# Patient Record
Sex: Female | Born: 1945 | Race: White | Hispanic: No | Marital: Married | Smoking: Never smoker
Health system: Southern US, Community
[De-identification: ages and names within clinical notes are randomized; demographics above are authoritative.]

## PROBLEM LIST (undated history)

## (undated) DIAGNOSIS — F329 Major depressive disorder, single episode, unspecified: Secondary | ICD-10-CM

## (undated) DIAGNOSIS — F32A Depression, unspecified: Secondary | ICD-10-CM

## (undated) HISTORY — PX: FACIAL COSMETIC SURGERY: SHX629

## (undated) HISTORY — PX: BREAST EXCISIONAL BIOPSY: SUR124

## (undated) HISTORY — PX: ABDOMINAL HYSTERECTOMY: SHX81

---

## 2006-04-06 ENCOUNTER — Other Ambulatory Visit: Admission: RE | Admit: 2006-04-06 | Discharge: 2006-04-06 | Payer: Self-pay | Admitting: Gynecology

## 2006-04-12 ENCOUNTER — Encounter: Admission: RE | Admit: 2006-04-12 | Discharge: 2006-04-12 | Payer: Self-pay | Admitting: Gynecology

## 2006-05-02 ENCOUNTER — Encounter: Admission: RE | Admit: 2006-05-02 | Discharge: 2006-05-02 | Payer: Self-pay | Admitting: Gynecology

## 2008-12-08 ENCOUNTER — Other Ambulatory Visit: Admission: RE | Admit: 2008-12-08 | Discharge: 2008-12-08 | Payer: Self-pay | Admitting: Family Medicine

## 2009-01-14 ENCOUNTER — Encounter: Admission: RE | Admit: 2009-01-14 | Discharge: 2009-01-14 | Payer: Self-pay | Admitting: Family Medicine

## 2010-03-02 ENCOUNTER — Encounter: Admission: RE | Admit: 2010-03-02 | Discharge: 2010-03-02 | Payer: Self-pay | Admitting: Family Medicine

## 2011-03-27 ENCOUNTER — Other Ambulatory Visit: Payer: Self-pay | Admitting: Family Medicine

## 2011-03-27 DIAGNOSIS — Z1231 Encounter for screening mammogram for malignant neoplasm of breast: Secondary | ICD-10-CM

## 2011-03-29 ENCOUNTER — Ambulatory Visit
Admission: RE | Admit: 2011-03-29 | Discharge: 2011-03-29 | Disposition: A | Discharge status: Home / Self Care | Payer: BC Managed Care – PPO | Source: Ambulatory Visit | Attending: Family Medicine | Admitting: Family Medicine

## 2011-03-29 DIAGNOSIS — Z1231 Encounter for screening mammogram for malignant neoplasm of breast: Secondary | ICD-10-CM

## 2012-03-29 ENCOUNTER — Other Ambulatory Visit: Payer: Self-pay | Admitting: Family Medicine

## 2012-03-29 DIAGNOSIS — Z1231 Encounter for screening mammogram for malignant neoplasm of breast: Secondary | ICD-10-CM

## 2012-04-11 ENCOUNTER — Ambulatory Visit
Admission: RE | Admit: 2012-04-11 | Discharge: 2012-04-11 | Disposition: A | Discharge status: Home / Self Care | Payer: BC Managed Care – PPO | Source: Ambulatory Visit | Attending: Family Medicine | Admitting: Family Medicine

## 2012-04-11 DIAGNOSIS — Z1231 Encounter for screening mammogram for malignant neoplasm of breast: Secondary | ICD-10-CM

## 2013-04-08 ENCOUNTER — Other Ambulatory Visit: Payer: Self-pay

## 2013-04-08 DIAGNOSIS — Z1231 Encounter for screening mammogram for malignant neoplasm of breast: Secondary | ICD-10-CM

## 2013-04-23 ENCOUNTER — Ambulatory Visit
Admission: RE | Admit: 2013-04-23 | Discharge: 2013-04-23 | Disposition: A | Discharge status: Home / Self Care | Payer: BC Managed Care – PPO | Source: Ambulatory Visit

## 2013-04-23 DIAGNOSIS — Z1231 Encounter for screening mammogram for malignant neoplasm of breast: Secondary | ICD-10-CM

## 2014-10-29 ENCOUNTER — Other Ambulatory Visit: Payer: Self-pay

## 2014-10-29 DIAGNOSIS — Z1231 Encounter for screening mammogram for malignant neoplasm of breast: Secondary | ICD-10-CM

## 2014-11-03 ENCOUNTER — Ambulatory Visit
Admission: RE | Admit: 2014-11-03 | Discharge: 2014-11-03 | Disposition: A | Discharge status: Home / Self Care | Payer: Commercial Managed Care - HMO | Source: Ambulatory Visit

## 2014-11-03 DIAGNOSIS — Z1231 Encounter for screening mammogram for malignant neoplasm of breast: Secondary | ICD-10-CM

## 2015-11-08 ENCOUNTER — Other Ambulatory Visit: Payer: Self-pay

## 2015-11-08 DIAGNOSIS — Z1231 Encounter for screening mammogram for malignant neoplasm of breast: Secondary | ICD-10-CM

## 2015-11-10 ENCOUNTER — Ambulatory Visit
Admission: RE | Admit: 2015-11-10 | Discharge: 2015-11-10 | Disposition: A | Discharge status: Home / Self Care | Payer: Medicare HMO | Source: Ambulatory Visit

## 2015-11-10 DIAGNOSIS — Z1231 Encounter for screening mammogram for malignant neoplasm of breast: Secondary | ICD-10-CM

## 2015-11-23 DIAGNOSIS — Z5181 Encounter for therapeutic drug level monitoring: Secondary | ICD-10-CM | POA: Diagnosis not present

## 2015-11-23 DIAGNOSIS — R7301 Impaired fasting glucose: Secondary | ICD-10-CM | POA: Diagnosis not present

## 2015-11-23 DIAGNOSIS — L99 Other disorders of skin and subcutaneous tissue in diseases classified elsewhere: Secondary | ICD-10-CM | POA: Diagnosis not present

## 2015-11-23 DIAGNOSIS — R69 Illness, unspecified: Secondary | ICD-10-CM | POA: Diagnosis not present

## 2015-11-23 DIAGNOSIS — K219 Gastro-esophageal reflux disease without esophagitis: Secondary | ICD-10-CM | POA: Diagnosis not present

## 2015-11-23 DIAGNOSIS — Z0000 Encounter for general adult medical examination without abnormal findings: Secondary | ICD-10-CM | POA: Diagnosis not present

## 2015-11-23 DIAGNOSIS — E785 Hyperlipidemia, unspecified: Secondary | ICD-10-CM | POA: Diagnosis not present

## 2015-11-23 DIAGNOSIS — Z79899 Other long term (current) drug therapy: Secondary | ICD-10-CM | POA: Diagnosis not present

## 2015-11-23 DIAGNOSIS — E559 Vitamin D deficiency, unspecified: Secondary | ICD-10-CM | POA: Diagnosis not present

## 2015-12-28 DIAGNOSIS — Z1211 Encounter for screening for malignant neoplasm of colon: Secondary | ICD-10-CM | POA: Diagnosis not present

## 2016-06-20 DIAGNOSIS — N959 Unspecified menopausal and perimenopausal disorder: Secondary | ICD-10-CM | POA: Diagnosis not present

## 2016-06-20 DIAGNOSIS — Z23 Encounter for immunization: Secondary | ICD-10-CM | POA: Diagnosis not present

## 2016-11-13 ENCOUNTER — Other Ambulatory Visit: Payer: Self-pay | Admitting: Family Medicine

## 2016-11-13 DIAGNOSIS — Z1231 Encounter for screening mammogram for malignant neoplasm of breast: Secondary | ICD-10-CM

## 2016-11-30 ENCOUNTER — Ambulatory Visit
Admission: RE | Admit: 2016-11-30 | Discharge: 2016-11-30 | Disposition: A | Discharge status: Home / Self Care | Payer: Medicare HMO | Source: Ambulatory Visit | Attending: Family Medicine | Admitting: Family Medicine

## 2016-11-30 ENCOUNTER — Ambulatory Visit: Payer: Medicare HMO

## 2016-11-30 DIAGNOSIS — Z1231 Encounter for screening mammogram for malignant neoplasm of breast: Secondary | ICD-10-CM | POA: Diagnosis not present

## 2016-12-01 ENCOUNTER — Other Ambulatory Visit: Payer: Self-pay | Admitting: Family Medicine

## 2016-12-01 DIAGNOSIS — R928 Other abnormal and inconclusive findings on diagnostic imaging of breast: Secondary | ICD-10-CM

## 2016-12-05 DIAGNOSIS — E559 Vitamin D deficiency, unspecified: Secondary | ICD-10-CM | POA: Diagnosis not present

## 2016-12-05 DIAGNOSIS — R739 Hyperglycemia, unspecified: Secondary | ICD-10-CM | POA: Diagnosis not present

## 2016-12-05 DIAGNOSIS — E785 Hyperlipidemia, unspecified: Secondary | ICD-10-CM | POA: Diagnosis not present

## 2016-12-05 DIAGNOSIS — R69 Illness, unspecified: Secondary | ICD-10-CM | POA: Diagnosis not present

## 2016-12-05 DIAGNOSIS — Z0000 Encounter for general adult medical examination without abnormal findings: Secondary | ICD-10-CM | POA: Diagnosis not present

## 2016-12-05 DIAGNOSIS — Z79899 Other long term (current) drug therapy: Secondary | ICD-10-CM | POA: Diagnosis not present

## 2016-12-06 ENCOUNTER — Other Ambulatory Visit: Payer: Self-pay | Admitting: Family Medicine

## 2016-12-06 ENCOUNTER — Ambulatory Visit
Admission: RE | Admit: 2016-12-06 | Discharge: 2016-12-06 | Disposition: A | Discharge status: Home / Self Care | Payer: Medicare HMO | Source: Ambulatory Visit | Attending: Family Medicine | Admitting: Family Medicine

## 2016-12-06 DIAGNOSIS — R922 Inconclusive mammogram: Secondary | ICD-10-CM | POA: Diagnosis not present

## 2016-12-06 DIAGNOSIS — R928 Other abnormal and inconclusive findings on diagnostic imaging of breast: Secondary | ICD-10-CM

## 2016-12-06 DIAGNOSIS — N6320 Unspecified lump in the left breast, unspecified quadrant: Secondary | ICD-10-CM

## 2016-12-06 DIAGNOSIS — N6012 Diffuse cystic mastopathy of left breast: Secondary | ICD-10-CM | POA: Diagnosis not present

## 2016-12-07 ENCOUNTER — Other Ambulatory Visit: Payer: Self-pay | Admitting: Family Medicine

## 2016-12-07 DIAGNOSIS — N6320 Unspecified lump in the left breast, unspecified quadrant: Secondary | ICD-10-CM

## 2016-12-08 ENCOUNTER — Ambulatory Visit
Admission: RE | Admit: 2016-12-08 | Discharge: 2016-12-08 | Disposition: A | Discharge status: Home / Self Care | Payer: Medicare HMO | Source: Ambulatory Visit | Attending: Family Medicine | Admitting: Family Medicine

## 2016-12-08 DIAGNOSIS — N6324 Unspecified lump in the left breast, lower inner quadrant: Secondary | ICD-10-CM | POA: Diagnosis not present

## 2016-12-08 DIAGNOSIS — N6320 Unspecified lump in the left breast, unspecified quadrant: Secondary | ICD-10-CM

## 2016-12-08 DIAGNOSIS — N6323 Unspecified lump in the left breast, lower outer quadrant: Secondary | ICD-10-CM | POA: Diagnosis not present

## 2016-12-08 DIAGNOSIS — D242 Benign neoplasm of left breast: Secondary | ICD-10-CM | POA: Diagnosis not present

## 2016-12-22 ENCOUNTER — Ambulatory Visit: Payer: Self-pay | Admitting: General Surgery

## 2016-12-22 DIAGNOSIS — D242 Benign neoplasm of left breast: Secondary | ICD-10-CM

## 2016-12-25 DIAGNOSIS — Z1211 Encounter for screening for malignant neoplasm of colon: Secondary | ICD-10-CM | POA: Diagnosis not present

## 2017-01-04 ENCOUNTER — Other Ambulatory Visit: Payer: Self-pay | Admitting: General Surgery

## 2017-01-04 ENCOUNTER — Encounter (HOSPITAL_BASED_OUTPATIENT_CLINIC_OR_DEPARTMENT_OTHER): Payer: Self-pay | Admitting: *Deleted

## 2017-01-04 DIAGNOSIS — D242 Benign neoplasm of left breast: Secondary | ICD-10-CM

## 2017-01-05 NOTE — Progress Notes (Signed)
Boost drink given with instructions to complete by 0700 dos, pt verbalized understanding.

## 2017-01-08 ENCOUNTER — Ambulatory Visit
Admission: RE | Admit: 2017-01-08 | Discharge: 2017-01-08 | Disposition: A | Discharge status: Home / Self Care | Payer: Medicare HMO | Source: Ambulatory Visit | Attending: General Surgery | Admitting: General Surgery

## 2017-01-08 DIAGNOSIS — D242 Benign neoplasm of left breast: Secondary | ICD-10-CM | POA: Diagnosis not present

## 2017-01-09 ENCOUNTER — Ambulatory Visit (HOSPITAL_BASED_OUTPATIENT_CLINIC_OR_DEPARTMENT_OTHER)
Admission: RE | Admit: 2017-01-09 | Discharge: 2017-01-09 | Disposition: A | Discharge status: Home / Self Care | Payer: Medicare HMO | Source: Ambulatory Visit | Attending: General Surgery | Admitting: General Surgery

## 2017-01-09 ENCOUNTER — Ambulatory Visit (HOSPITAL_BASED_OUTPATIENT_CLINIC_OR_DEPARTMENT_OTHER): Payer: Medicare HMO | Admitting: Anesthesiology

## 2017-01-09 ENCOUNTER — Ambulatory Visit
Admission: RE | Admit: 2017-01-09 | Discharge: 2017-01-09 | Disposition: A | Discharge status: Home / Self Care | Payer: Medicare HMO | Source: Ambulatory Visit | Attending: General Surgery | Admitting: General Surgery

## 2017-01-09 ENCOUNTER — Encounter (HOSPITAL_BASED_OUTPATIENT_CLINIC_OR_DEPARTMENT_OTHER)
Admission: RE | Disposition: A | Discharge status: Home / Self Care | Payer: Self-pay | Source: Ambulatory Visit | Attending: General Surgery

## 2017-01-09 ENCOUNTER — Encounter (HOSPITAL_BASED_OUTPATIENT_CLINIC_OR_DEPARTMENT_OTHER): Payer: Self-pay | Admitting: *Deleted

## 2017-01-09 DIAGNOSIS — F329 Major depressive disorder, single episode, unspecified: Secondary | ICD-10-CM | POA: Diagnosis not present

## 2017-01-09 DIAGNOSIS — Z79899 Other long term (current) drug therapy: Secondary | ICD-10-CM | POA: Insufficient documentation

## 2017-01-09 DIAGNOSIS — Z811 Family history of alcohol abuse and dependence: Secondary | ICD-10-CM | POA: Insufficient documentation

## 2017-01-09 DIAGNOSIS — N6012 Diffuse cystic mastopathy of left breast: Secondary | ICD-10-CM | POA: Diagnosis not present

## 2017-01-09 DIAGNOSIS — Z8349 Family history of other endocrine, nutritional and metabolic diseases: Secondary | ICD-10-CM | POA: Insufficient documentation

## 2017-01-09 DIAGNOSIS — Z90710 Acquired absence of both cervix and uterus: Secondary | ICD-10-CM | POA: Diagnosis not present

## 2017-01-09 DIAGNOSIS — D242 Benign neoplasm of left breast: Secondary | ICD-10-CM | POA: Diagnosis not present

## 2017-01-09 DIAGNOSIS — Z803 Family history of malignant neoplasm of breast: Secondary | ICD-10-CM | POA: Diagnosis not present

## 2017-01-09 DIAGNOSIS — R921 Mammographic calcification found on diagnostic imaging of breast: Secondary | ICD-10-CM | POA: Diagnosis not present

## 2017-01-09 DIAGNOSIS — R69 Illness, unspecified: Secondary | ICD-10-CM | POA: Diagnosis not present

## 2017-01-09 DIAGNOSIS — Z9889 Other specified postprocedural states: Secondary | ICD-10-CM | POA: Insufficient documentation

## 2017-01-09 DIAGNOSIS — Z818 Family history of other mental and behavioral disorders: Secondary | ICD-10-CM | POA: Insufficient documentation

## 2017-01-09 DIAGNOSIS — R928 Other abnormal and inconclusive findings on diagnostic imaging of breast: Secondary | ICD-10-CM | POA: Diagnosis not present

## 2017-01-09 HISTORY — PX: BREAST EXCISIONAL BIOPSY: SUR124

## 2017-01-09 HISTORY — PX: BREAST LUMPECTOMY WITH RADIOACTIVE SEED LOCALIZATION: SHX6424

## 2017-01-09 HISTORY — DX: Major depressive disorder, single episode, unspecified: F32.9

## 2017-01-09 HISTORY — DX: Depression, unspecified: F32.A

## 2017-01-09 SURGERY — BREAST LUMPECTOMY WITH RADIOACTIVE SEED LOCALIZATION
Anesthesia: General | Site: Breast | Laterality: Left

## 2017-01-09 MED ORDER — LACTATED RINGERS IV SOLN
INTRAVENOUS | Status: DC
  Administered 2017-01-09 (×2): via INTRAVENOUS

## 2017-01-09 MED ORDER — LIDOCAINE 2% (20 MG/ML) 5 ML SYRINGE
INTRAMUSCULAR | Status: AC
  Filled 2017-01-09: qty 5

## 2017-01-09 MED ORDER — FENTANYL CITRATE (PF) 100 MCG/2ML IJ SOLN
INTRAMUSCULAR | Status: AC
  Filled 2017-01-09: qty 2

## 2017-01-09 MED ORDER — ACETAMINOPHEN 500 MG PO TABS
1000.0000 mg | ORAL_TABLET | ORAL | Status: AC
  Administered 2017-01-09: 1000 mg via ORAL

## 2017-01-09 MED ORDER — CEFAZOLIN SODIUM-DEXTROSE 2-4 GM/100ML-% IV SOLN
INTRAVENOUS | Status: AC
  Filled 2017-01-09: qty 100

## 2017-01-09 MED ORDER — GLYCOPYRROLATE 0.2 MG/ML IV SOSY
PREFILLED_SYRINGE | INTRAVENOUS | Status: DC | PRN
  Administered 2017-01-09: .2 mg via INTRAVENOUS

## 2017-01-09 MED ORDER — ONDANSETRON HCL 4 MG/2ML IJ SOLN
INTRAMUSCULAR | Status: AC
  Filled 2017-01-09: qty 2

## 2017-01-09 MED ORDER — DEXAMETHASONE SODIUM PHOSPHATE 10 MG/ML IJ SOLN
INTRAMUSCULAR | Status: AC
  Filled 2017-01-09: qty 1

## 2017-01-09 MED ORDER — PROPOFOL 10 MG/ML IV BOLUS
INTRAVENOUS | Status: AC
  Filled 2017-01-09: qty 20

## 2017-01-09 MED ORDER — BUPIVACAINE HCL (PF) 0.25 % IJ SOLN
INTRAMUSCULAR | Status: AC
  Filled 2017-01-09: qty 30

## 2017-01-09 MED ORDER — BUPIVACAINE-EPINEPHRINE (PF) 0.5% -1:200000 IJ SOLN
INTRAMUSCULAR | Status: AC
  Filled 2017-01-09: qty 30

## 2017-01-09 MED ORDER — FENTANYL CITRATE (PF) 100 MCG/2ML IJ SOLN
50.0000 ug | INTRAMUSCULAR | Status: DC | PRN
  Administered 2017-01-09 (×2): 50 ug via INTRAVENOUS

## 2017-01-09 MED ORDER — SCOPOLAMINE 1 MG/3DAYS TD PT72: 1.0000 | MEDICATED_PATCH | Freq: Once | TRANSDERMAL | Status: DC | PRN

## 2017-01-09 MED ORDER — 0.9 % SODIUM CHLORIDE (POUR BTL) OPTIME
TOPICAL | Status: DC | PRN
  Administered 2017-01-09: 1000 mL

## 2017-01-09 MED ORDER — BUPIVACAINE-EPINEPHRINE 0.5% -1:200000 IJ SOLN
INTRAMUSCULAR | Status: DC | PRN
Start: 2017-01-09 — End: 2017-01-09
  Administered 2017-01-09: 10 mL

## 2017-01-09 MED ORDER — CEFAZOLIN SODIUM-DEXTROSE 2-4 GM/100ML-% IV SOLN
2.0000 g | INTRAVENOUS | Status: AC
  Administered 2017-01-09: 2 g via INTRAVENOUS

## 2017-01-09 MED ORDER — ACETAMINOPHEN 500 MG PO TABS
ORAL_TABLET | ORAL | Status: AC
  Filled 2017-01-09: qty 2

## 2017-01-09 MED ORDER — HYDROMORPHONE HCL 1 MG/ML IJ SOLN: 0.2500 mg | INTRAMUSCULAR | Status: DC | PRN

## 2017-01-09 MED ORDER — MIDAZOLAM HCL 2 MG/2ML IJ SOLN
INTRAMUSCULAR | Status: AC
  Filled 2017-01-09: qty 2

## 2017-01-09 MED ORDER — LIDOCAINE 2% (20 MG/ML) 5 ML SYRINGE
INTRAMUSCULAR | Status: DC | PRN
  Administered 2017-01-09: 80 mg via INTRAVENOUS

## 2017-01-09 MED ORDER — CHLORHEXIDINE GLUCONATE CLOTH 2 % EX PADS: 6.0000 | MEDICATED_PAD | Freq: Once | CUTANEOUS | Status: DC

## 2017-01-09 MED ORDER — CELECOXIB 200 MG PO CAPS
ORAL_CAPSULE | ORAL | Status: AC
  Filled 2017-01-09: qty 2

## 2017-01-09 MED ORDER — CELECOXIB 400 MG PO CAPS
400.0000 mg | ORAL_CAPSULE | ORAL | Status: AC
  Administered 2017-01-09: 400 mg via ORAL

## 2017-01-09 MED ORDER — SUCCINYLCHOLINE CHLORIDE 200 MG/10ML IV SOSY
PREFILLED_SYRINGE | INTRAVENOUS | Status: DC | PRN
  Administered 2017-01-09: 100 mg via INTRAVENOUS

## 2017-01-09 MED ORDER — MIDAZOLAM HCL 2 MG/2ML IJ SOLN
1.0000 mg | INTRAMUSCULAR | Status: DC | PRN
  Administered 2017-01-09: 1 mg via INTRAVENOUS

## 2017-01-09 MED ORDER — PROPOFOL 10 MG/ML IV BOLUS
INTRAVENOUS | Status: DC | PRN
  Administered 2017-01-09 (×3): 100 mg via INTRAVENOUS

## 2017-01-09 MED ORDER — PROMETHAZINE HCL 25 MG/ML IJ SOLN: 6.2500 mg | INTRAMUSCULAR | Status: DC | PRN

## 2017-01-09 MED ORDER — HYDROCODONE-ACETAMINOPHEN 5-325 MG PO TABS: 1.0000 | ORAL_TABLET | ORAL | 0 refills | Status: AC | PRN

## 2017-01-09 MED ORDER — GABAPENTIN 300 MG PO CAPS
ORAL_CAPSULE | ORAL | Status: AC
  Filled 2017-01-09: qty 1

## 2017-01-09 MED ORDER — GABAPENTIN 300 MG PO CAPS
300.0000 mg | ORAL_CAPSULE | ORAL | Status: AC
  Administered 2017-01-09: 300 mg via ORAL

## 2017-01-09 MED ORDER — DEXAMETHASONE SODIUM PHOSPHATE 4 MG/ML IJ SOLN
INTRAMUSCULAR | Status: DC | PRN
  Administered 2017-01-09: 10 mg via INTRAVENOUS

## 2017-01-09 SURGICAL SUPPLY — 45 items
ADH SKN CLS APL DERMABOND .7 (GAUZE/BANDAGES/DRESSINGS) ×1
BINDER BREAST LRG (GAUZE/BANDAGES/DRESSINGS) ×1 IMPLANT
BLADE SURG 15 STRL LF DISP TIS (BLADE) ×1 IMPLANT
BLADE SURG 15 STRL SS (BLADE) ×2
CANISTER SUC SOCK COL 7IN (MISCELLANEOUS) IMPLANT
CANISTER SUCT 1200ML W/VALVE (MISCELLANEOUS) IMPLANT
CHLORAPREP W/TINT 26ML (MISCELLANEOUS) ×2 IMPLANT
CLIP TI WIDE RED SMALL 6 (CLIP) IMPLANT
COVER BACK TABLE 60X90IN (DRAPES) ×2 IMPLANT
COVER MAYO STAND STRL (DRAPES) ×2 IMPLANT
COVER PROBE W GEL 5X96 (DRAPES) ×2 IMPLANT
DERMABOND ADVANCED (GAUZE/BANDAGES/DRESSINGS) ×1
DERMABOND ADVANCED .7 DNX12 (GAUZE/BANDAGES/DRESSINGS) ×1 IMPLANT
DEVICE DUBIN W/COMP PLATE 8390 (MISCELLANEOUS) ×2 IMPLANT
DRAPE LAPAROSCOPIC ABDOMINAL (DRAPES) ×2 IMPLANT
DRAPE UTILITY XL STRL (DRAPES) ×2 IMPLANT
ELECT COATED BLADE 2.86 ST (ELECTRODE) ×2 IMPLANT
ELECT REM PT RETURN 9FT ADLT (ELECTROSURGICAL) ×2
ELECTRODE REM PT RTRN 9FT ADLT (ELECTROSURGICAL) ×1 IMPLANT
GLOVE BIOGEL PI IND STRL 6.5 (GLOVE) ×1 IMPLANT
GLOVE BIOGEL PI IND STRL 8 (GLOVE) ×1 IMPLANT
GLOVE BIOGEL PI INDICATOR 6.5 (GLOVE) ×1
GLOVE BIOGEL PI INDICATOR 8 (GLOVE) ×1
GLOVE ECLIPSE 7.5 STRL STRAW (GLOVE) ×2 IMPLANT
GLOVE SURG SS PI 6.5 STRL IVOR (GLOVE) ×1 IMPLANT
GOWN STRL REUS W/ TWL LRG LVL3 (GOWN DISPOSABLE) ×1 IMPLANT
GOWN STRL REUS W/ TWL XL LVL3 (GOWN DISPOSABLE) ×1 IMPLANT
GOWN STRL REUS W/TWL LRG LVL3 (GOWN DISPOSABLE) ×2
GOWN STRL REUS W/TWL XL LVL3 (GOWN DISPOSABLE) ×2
ILLUMINATOR WAVEGUIDE N/F (MISCELLANEOUS) IMPLANT
KIT MARKER MARGIN INK (KITS) ×2 IMPLANT
NDL HYPO 25X1 1.5 SAFETY (NEEDLE) ×1 IMPLANT
NEEDLE HYPO 25X1 1.5 SAFETY (NEEDLE) ×2 IMPLANT
NS IRRIG 1000ML POUR BTL (IV SOLUTION) IMPLANT
PACK BASIN DAY SURGERY FS (CUSTOM PROCEDURE TRAY) ×2 IMPLANT
PENCIL BUTTON HOLSTER BLD 10FT (ELECTRODE) ×2 IMPLANT
SLEEVE SCD COMPRESS KNEE MED (MISCELLANEOUS) ×2 IMPLANT
SPONGE LAP 4X18 X RAY DECT (DISPOSABLE) ×2 IMPLANT
SUT MON AB 5-0 PS2 18 (SUTURE) ×2 IMPLANT
SUT VICRYL 3-0 CR8 SH (SUTURE) ×2 IMPLANT
SYR CONTROL 10ML LL (SYRINGE) ×2 IMPLANT
TOWEL OR 17X24 6PK STRL BLUE (TOWEL DISPOSABLE) ×2 IMPLANT
TOWEL OR NON WOVEN STRL DISP B (DISPOSABLE) ×2 IMPLANT
TUBE CONNECTING 20X1/4 (TUBING) IMPLANT
YANKAUER SUCT BULB TIP NO VENT (SUCTIONS) IMPLANT

## 2017-01-09 NOTE — Op Note (Signed)
Preoperative Diagnosis: LEFT BREAST PAPILLOMA  Postoprative Diagnosis: LEFT BREAST PAPILLOMA  Procedure: Procedure(s): BREAST LUMPECTOMY WITH RADIOACTIVE SEED LOCALIZATION   Surgeon: Excell Seltzer T   Assistants: None  Anesthesia:  General LMA anesthesia  Indications: Patient is a 71 year old female with a strong family history of breast cancer who on a recent screening mammogram showed a new subareolar mass measuring 5-6 mm. 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 Following preoperative discussion detailed elsewhere we have elected to proceed with excision to rule out the small chance of underlying malignancy.    Procedure Detail:  Seed placement was confirmed in the holding area. Patient was taken to the operating room, placed in the supine position on the operating table and laryngeal mask general anesthesia induced. The left breast was widely sterilely prepped and draped. Patient timeout was performed and correct procedure verified. She had received preoperative IV antibiotics. The seed was localized just off the areolar border inferiorly and laterally. A curvilinear incision was made at the areolar border and a short skin and subcutaneous flap raised over the area of high counts. Using the seed for guidance a small, approximately 1/2-2 cm globular specimen of breast tissue was excised around the seed. This was inked for margins. Specimen x-ray showed the seed and marking clip contained within the specimen. There were no surrounding palpable abnormalities. Soft tissue was infiltrated with Marcaine. Deep breast and subcutaneous tissue was closed with interrupted 3-0 Vicryl after marking the lumpectomy site. Skin incision was closed with subcuticular Monocryl and Dermabond. Sponge needle and instrument counts were correct.   Findings: As above  Estimated Blood Loss:  Minimal         Drains: None  Blood Given: none          Specimens: Left breast tissue, oriented     Complications:  * No complications entered in OR log *         Disposition: PACU - hemodynamically stable.         Condition: stable

## 2017-01-09 NOTE — Discharge Instructions (Signed)

## 2017-01-09 NOTE — Interval H&P Note (Signed)
History and Physical Interval Note:  01/09/2017 1:10 PM  Nicole Middleton  has presented today for surgery, with the diagnosis of LEFT BREAST PAPILLOMA  The various methods of treatment have been discussed with the patient and family. After consideration of risks, benefits and other options for treatment, the patient has consented to  Procedure(s): BREAST LUMPECTOMY WITH RADIOACTIVE SEED LOCALIZATION (Left) as a surgical intervention .  The patient's history has been reviewed, patient examined, no change in status, stable for surgery.  I have reviewed the patient's chart and labs.  Questions were answered to the patient's satisfaction.     Whitaker Holderman T

## 2017-01-09 NOTE — Transfer of Care (Signed)
Immediate Anesthesia Transfer of Care Note  Patient: Nicole Middleton  Procedure(s) Performed: Procedure(s): BREAST LUMPECTOMY WITH RADIOACTIVE SEED LOCALIZATION (Left)  Patient Location: PACU  Anesthesia Type:General  Level of Consciousness: awake, sedated and patient cooperative  Airway & Oxygen Therapy: Patient Spontanous Breathing and Patient connected to face mask oxygen  Post-op Assessment: Report given to RN and Post -op Vital signs reviewed and stable  Post vital signs: Reviewed and stable  Last Vitals:  Vitals:   01/09/17 0947  BP: 133/67  Pulse: 73  Resp: 20  Temp: 36.7 C    Last Pain:  Vitals:   01/09/17 0947  TempSrc: Oral         Complications: No apparent anesthesia complications

## 2017-01-09 NOTE — Anesthesia Procedure Notes (Signed)
Procedure Name: LMA Insertion Performed by: Lyndee Leo Pre-anesthesia Checklist: Patient identified, Emergency Drugs available, Suction available and Patient being monitored Patient Re-evaluated:Patient Re-evaluated prior to inductionOxygen Delivery Method: Circle system utilized Preoxygenation: Pre-oxygenation with 100% oxygen Intubation Type: IV induction Ventilation: Mask ventilation without difficulty LMA: LMA inserted LMA Size: 4.0 Number of attempts: 1 Airway Equipment and Method: Bite block Placement Confirmation: positive ETCO2 Tube secured with: Tape Dental Injury: Teeth and Oropharynx as per pre-operative assessment

## 2017-01-09 NOTE — H&P (Signed)
History of Present Illness Marland Kitchen T. Raquell Richer MD; 12/22/2016 9:21 AM) The patient is a 71 year old female who presents with a complaint of Breast problems. She is referred by Dr. Sabino Niemann following a recent abnormal mammogram and core biopsy of the left breast revealing ductal papilloma. The patient has a history of fibrocystic disease requiring cyst aspiration in the past and has had an open left breast biopsy in her 71s. She is postmenopausal although still having some hot flashes. No other significant history of breast disease other than above. She does have a family history of breast cancer in her mother who died from breast cancer at about age 103. Recent abnormal screening mammogram revealed a possible mass in the left breast. Subsequent diagnostic mammogram showed multiple similar appearing masses throughout the left breast and ultrasound showed multiple simple and complicated cysts in the left breast but also an intraductal mass at the 6 o'clock position subareolar measuring 5 mm. Large core needle biopsy was recommended and performed which has revealed a ductal papilloma. Patient was referred for consideration for excision. She is not having any breast symptoms, specifically nipple discharge or mass or skin changes. She is not taking any hormonal medications.   Past Surgical History Mammie Lorenzo, LPN; 6/31/4970 2:63 AM) Breast Biopsy  Left. Breast Mass; Local Excision  Left. Hysterectomy (not due to cancer) - Partial  Oral Surgery   Diagnostic Studies History Mammie Lorenzo, LPN; 7/85/8850 2:77 AM) Colonoscopy  5-10 years ago Mammogram  within last year Pap Smear  1-5 years ago  Allergies Mammie Lorenzo, LPN; 12/21/8784 7:67 AM) No Known Drug Allergies 12/22/2016  Medication History Mammie Lorenzo, LPN; 10/21/4707 6:28 AM) Citalopram Hydrobromide (40MG  Tablet, Oral) Active. Venlafaxine HCl ER (37.5MG  Capsule ER 24HR, Oral) Active. Calcium-Vitamin D (250-125MG -UNIT  Tablet, Oral) Active. Vitamin D (Cholecalciferol) (1000UNIT Capsule, Oral) Active. Medications Reconciled  Social History Mammie Lorenzo, LPN; 3/66/2947 6:54 AM) Alcohol use  Occasional alcohol use. Caffeine use  Coffee. No drug use  Tobacco use  Never smoker.  Family History Mammie Lorenzo, LPN; 6/50/3546 5:68 AM) Alcohol Abuse  Brother, Father. Breast Cancer  Mother. Depression  Mother. Thyroid problems  Father.  Pregnancy / Birth History Mammie Lorenzo, LPN; 10/07/5168 0:17 AM) Age at menarche  41 years. Contraceptive History  Oral contraceptives. Gravida  3 Length (months) of breastfeeding  3-6 Maternal age  31-25 Para  58  Other Problems Mammie Lorenzo, LPN; 4/94/4967 5:91 AM) Depression  Lump In Breast  Migraine Headache  Transfusion history  Umbilical Hernia Repair     Review of Systems Claiborne Billings Dockery LPN; 6/38/4665 9:93 AM) General Not Present- Appetite Loss, Chills, Fatigue, Fever, Night Sweats, Weight Gain and Weight Loss. Skin Not Present- Change in Wart/Mole, Dryness, Hives, Jaundice, New Lesions, Non-Healing Wounds, Rash and Ulcer. HEENT Present- Wears glasses/contact lenses. Not Present- Earache, Hearing Loss, Hoarseness, Nose Bleed, Oral Ulcers, Ringing in the Ears, Seasonal Allergies, Sinus Pain, Sore Throat, Visual Disturbances and Yellow Eyes. Respiratory Not Present- Bloody sputum, Chronic Cough, Difficulty Breathing, Snoring and Wheezing. Breast Present- Breast Mass. Not Present- Breast Pain, Nipple Discharge and Skin Changes. Cardiovascular Not Present- Chest Pain, Difficulty Breathing Lying Down, Leg Cramps, Palpitations, Rapid Heart Rate, Shortness of Breath and Swelling of Extremities. Gastrointestinal Not Present- Abdominal Pain, Bloating, Bloody Stool, Change in Bowel Habits, Chronic diarrhea, Constipation, Difficulty Swallowing, Excessive gas, Gets full quickly at meals, Hemorrhoids, Indigestion, Nausea, Rectal Pain and  Vomiting. Female Genitourinary Not Present- Frequency, Nocturia, Painful Urination, Pelvic Pain and Urgency. Musculoskeletal Not Present-  Back Pain, Joint Pain, Joint Stiffness, Muscle Pain, Muscle Weakness and Swelling of Extremities. Neurological Not Present- Decreased Memory, Fainting, Headaches, Numbness, Seizures, Tingling, Tremor, Trouble walking and Weakness. Psychiatric Present- Depression. Not Present- Anxiety, Bipolar, Change in Sleep Pattern, Fearful and Frequent crying. Endocrine Present- Hot flashes. Not Present- Cold Intolerance, Excessive Hunger, Hair Changes, Heat Intolerance and New Diabetes. Hematology Not Present- Blood Thinners, Easy Bruising, Excessive bleeding, Gland problems, HIV and Persistent Infections.  Vitals Claiborne Billings Dockery LPN; 03/02/2978 8:92 AM) 12/22/2016 9:07 AM Weight: 160.4 lb Height: 66in Body Surface Area: 1.82 m Body Mass Index: 25.89 kg/m  Temp.: 98.75F(Oral)  Pulse: 78 (Regular)  BP: 118/72 (Sitting, Left Arm, Standard)       Physical Exam Marland Kitchen T. Ashkan Chamberland MD; 12/22/2016 9:23 AM) The physical exam findings are as follows: Note:General: Alert, well-developed and well nourished Caucasian female, in no distress Skin: Warm and dry without rash or infection. HEENT: No palpable masses or thyromegaly. Sclera nonicteric. Pupils equal round and reactive. Lymph nodes: No cervical, supraclavicular, nodes palpable. Breasts: No palpable masses in either breast. No skin changes. No nipple crusting or inversion or discharge Lungs: Breath sounds clear and equal. No wheezing or increased work of breathing. Cardiovascular: Regular rate and rhythm without murmer. No JVD or edema. Peripheral pulses intact. No carotid bruits. Extremities: No edema or joint swelling or deformity. No chronic venous stasis changes. Neurologic: Alert and fully oriented. Gait normal. No focal weakness. Psychiatric: Normal mood and affect. Thought content appropriate with  normal judgement and insight    Assessment & Plan Marland Kitchen T. Mixtli Reno MD; 12/22/2016 9:33 AM) Madelyn Flavors PAPILLOMA OF BREAST, LEFT (D24.2) Impression: 71 year old female with new diagnosis of 5 mm intraductal papilloma at the 6 o'clock position left breast. She does have a family history of breast cancer in her mother. I discussed the diagnosis with the patient and her husband. We discussed that there is a small incidence of associated early breast cancer. I discussed options of observation with imaging versus excision for diagnosis. She would prefer excision which I think is the most reasonable course particularly with her family history of breast cancer. We discussed radioactive seed localized left breast lumpectomy. Discussed possible findings. Discussed risks of bleeding, infection and anesthetic risks and expected recovery. All her questions were answered. Current Plans Pt Education - Pamphlet Given - Breast Biopsy: discussed with patient and provided information. Radioactive seed localized left breast lumpectomy under general anesthesia as an outpatient

## 2017-01-09 NOTE — Anesthesia Preprocedure Evaluation (Signed)
Anesthesia Evaluation  Patient identified by MRN, date of birth, ID band Patient awake    Reviewed: Allergy & Precautions, NPO status , Patient's Chart, lab work & pertinent test results  Airway Mallampati: II       Dental no notable dental hx.    Pulmonary neg pulmonary ROS,    breath sounds clear to auscultation       Cardiovascular negative cardio ROS   Rhythm:Regular Rate:Normal     Neuro/Psych negative neurological ROS  negative psych ROS   GI/Hepatic negative GI ROS, Neg liver ROS,   Endo/Other  negative endocrine ROS  Renal/GU negative Renal ROS  negative genitourinary   Musculoskeletal negative musculoskeletal ROS (+)   Abdominal   Peds negative pediatric ROS (+)  Hematology negative hematology ROS (+)   Anesthesia Other Findings   Reproductive/Obstetrics negative OB ROS                             Anesthesia Physical Anesthesia Plan  ASA: I  Anesthesia Plan: General   Post-op Pain Management:    Induction: Intravenous  Airway Management Planned: LMA  Additional Equipment:   Intra-op Plan:   Post-operative Plan: Extubation in OR  Informed Consent: I have reviewed the patients History and Physical, chart, labs and discussed the procedure including the risks, benefits and alternatives for the proposed anesthesia with the patient or authorized representative who has indicated his/her understanding and acceptance.   Dental advisory given  Plan Discussed with: CRNA  Anesthesia Plan Comments:         Anesthesia Quick Evaluation

## 2017-01-09 NOTE — Anesthesia Postprocedure Evaluation (Addendum)
Anesthesia Post Note  Patient: Nicole Middleton  Procedure(s) Performed: Procedure(s) (LRB): BREAST LUMPECTOMY WITH RADIOACTIVE SEED LOCALIZATION (Left)  Patient location during evaluation: PACU Anesthesia Type: General Level of consciousness: awake and oriented Pain management: pain level controlled Vital Signs Assessment: post-procedure vital signs reviewed and stable Respiratory status: spontaneous breathing, nonlabored ventilation, respiratory function stable and patient connected to nasal cannula oxygen Cardiovascular status: blood pressure returned to baseline and stable Postop Assessment: no signs of nausea or vomiting Anesthetic complications: no       Last Vitals:  Vitals:   01/09/17 1347 01/09/17 1410  BP:  126/78  Pulse: 90 87  Resp: 17 18  Temp:  36.4 C    Last Pain:  Vitals:   01/09/17 1410  TempSrc: Oral  PainSc: 3                  Emarie Paul,JAMES TERRILL

## 2017-01-10 ENCOUNTER — Encounter (HOSPITAL_BASED_OUTPATIENT_CLINIC_OR_DEPARTMENT_OTHER): Payer: Self-pay | Admitting: General Surgery

## 2017-02-09 NOTE — Addendum Note (Signed)
Addendum  created 02/09/17 1429 by Rica Koyanagi, MD   Sign clinical note

## 2017-05-17 DIAGNOSIS — R69 Illness, unspecified: Secondary | ICD-10-CM | POA: Diagnosis not present

## 2017-06-14 DIAGNOSIS — R69 Illness, unspecified: Secondary | ICD-10-CM | POA: Diagnosis not present

## 2017-06-14 DIAGNOSIS — G4700 Insomnia, unspecified: Secondary | ICD-10-CM | POA: Diagnosis not present

## 2017-12-21 DIAGNOSIS — D369 Benign neoplasm, unspecified site: Secondary | ICD-10-CM | POA: Diagnosis not present

## 2017-12-21 DIAGNOSIS — Z79899 Other long term (current) drug therapy: Secondary | ICD-10-CM | POA: Diagnosis not present

## 2017-12-21 DIAGNOSIS — R69 Illness, unspecified: Secondary | ICD-10-CM | POA: Diagnosis not present

## 2017-12-21 DIAGNOSIS — E785 Hyperlipidemia, unspecified: Secondary | ICD-10-CM | POA: Diagnosis not present

## 2017-12-21 DIAGNOSIS — Z0000 Encounter for general adult medical examination without abnormal findings: Secondary | ICD-10-CM | POA: Diagnosis not present

## 2017-12-21 DIAGNOSIS — E559 Vitamin D deficiency, unspecified: Secondary | ICD-10-CM | POA: Diagnosis not present

## 2017-12-21 DIAGNOSIS — Z1211 Encounter for screening for malignant neoplasm of colon: Secondary | ICD-10-CM | POA: Diagnosis not present

## 2017-12-21 DIAGNOSIS — Z9889 Other specified postprocedural states: Secondary | ICD-10-CM | POA: Diagnosis not present

## 2017-12-21 DIAGNOSIS — R7303 Prediabetes: Secondary | ICD-10-CM | POA: Diagnosis not present

## 2018-02-18 DIAGNOSIS — E2839 Other primary ovarian failure: Secondary | ICD-10-CM | POA: Diagnosis not present

## 2018-02-18 DIAGNOSIS — M8588 Other specified disorders of bone density and structure, other site: Secondary | ICD-10-CM | POA: Diagnosis not present

## 2018-05-27 DIAGNOSIS — R69 Illness, unspecified: Secondary | ICD-10-CM | POA: Diagnosis not present

## 2018-10-02 DIAGNOSIS — R69 Illness, unspecified: Secondary | ICD-10-CM | POA: Diagnosis not present

## 2018-10-09 DIAGNOSIS — R69 Illness, unspecified: Secondary | ICD-10-CM | POA: Diagnosis not present

## 2018-10-12 DIAGNOSIS — R69 Illness, unspecified: Secondary | ICD-10-CM | POA: Diagnosis not present

## 2018-10-23 DIAGNOSIS — R69 Illness, unspecified: Secondary | ICD-10-CM | POA: Diagnosis not present

## 2018-11-10 DIAGNOSIS — R69 Illness, unspecified: Secondary | ICD-10-CM | POA: Diagnosis not present

## 2018-12-11 DIAGNOSIS — R69 Illness, unspecified: Secondary | ICD-10-CM | POA: Diagnosis not present

## 2019-01-10 DIAGNOSIS — R69 Illness, unspecified: Secondary | ICD-10-CM | POA: Diagnosis not present

## 2019-01-22 DIAGNOSIS — E559 Vitamin D deficiency, unspecified: Secondary | ICD-10-CM | POA: Diagnosis not present

## 2019-01-22 DIAGNOSIS — Z79899 Other long term (current) drug therapy: Secondary | ICD-10-CM | POA: Diagnosis not present

## 2019-01-22 DIAGNOSIS — E785 Hyperlipidemia, unspecified: Secondary | ICD-10-CM | POA: Diagnosis not present

## 2019-01-22 DIAGNOSIS — R7303 Prediabetes: Secondary | ICD-10-CM | POA: Diagnosis not present

## 2019-01-27 DIAGNOSIS — G4700 Insomnia, unspecified: Secondary | ICD-10-CM | POA: Diagnosis not present

## 2019-01-27 DIAGNOSIS — R69 Illness, unspecified: Secondary | ICD-10-CM | POA: Diagnosis not present

## 2019-01-27 DIAGNOSIS — K219 Gastro-esophageal reflux disease without esophagitis: Secondary | ICD-10-CM | POA: Diagnosis not present

## 2019-01-27 DIAGNOSIS — Z9889 Other specified postprocedural states: Secondary | ICD-10-CM | POA: Diagnosis not present

## 2019-01-27 DIAGNOSIS — E559 Vitamin D deficiency, unspecified: Secondary | ICD-10-CM | POA: Diagnosis not present

## 2019-01-27 DIAGNOSIS — E785 Hyperlipidemia, unspecified: Secondary | ICD-10-CM | POA: Diagnosis not present

## 2019-01-27 DIAGNOSIS — Z0000 Encounter for general adult medical examination without abnormal findings: Secondary | ICD-10-CM | POA: Diagnosis not present

## 2019-01-27 DIAGNOSIS — M8580 Other specified disorders of bone density and structure, unspecified site: Secondary | ICD-10-CM | POA: Diagnosis not present

## 2019-01-27 DIAGNOSIS — D369 Benign neoplasm, unspecified site: Secondary | ICD-10-CM | POA: Diagnosis not present

## 2019-01-27 DIAGNOSIS — R7303 Prediabetes: Secondary | ICD-10-CM | POA: Diagnosis not present

## 2019-02-10 DIAGNOSIS — R69 Illness, unspecified: Secondary | ICD-10-CM | POA: Diagnosis not present

## 2019-02-24 DIAGNOSIS — R69 Illness, unspecified: Secondary | ICD-10-CM | POA: Diagnosis not present

## 2019-03-12 DIAGNOSIS — R69 Illness, unspecified: Secondary | ICD-10-CM | POA: Diagnosis not present

## 2019-04-12 DIAGNOSIS — R69 Illness, unspecified: Secondary | ICD-10-CM | POA: Diagnosis not present

## 2019-05-13 DIAGNOSIS — R69 Illness, unspecified: Secondary | ICD-10-CM | POA: Diagnosis not present

## 2019-06-09 DIAGNOSIS — R69 Illness, unspecified: Secondary | ICD-10-CM | POA: Diagnosis not present

## 2019-06-12 DIAGNOSIS — R69 Illness, unspecified: Secondary | ICD-10-CM | POA: Diagnosis not present

## 2019-06-24 DIAGNOSIS — R69 Illness, unspecified: Secondary | ICD-10-CM | POA: Diagnosis not present

## 2019-07-13 DIAGNOSIS — R69 Illness, unspecified: Secondary | ICD-10-CM | POA: Diagnosis not present

## 2019-08-04 DIAGNOSIS — R69 Illness, unspecified: Secondary | ICD-10-CM | POA: Diagnosis not present

## 2019-08-12 DIAGNOSIS — R69 Illness, unspecified: Secondary | ICD-10-CM | POA: Diagnosis not present

## 2019-08-12 DIAGNOSIS — K219 Gastro-esophageal reflux disease without esophagitis: Secondary | ICD-10-CM | POA: Diagnosis not present

## 2019-09-12 DIAGNOSIS — R69 Illness, unspecified: Secondary | ICD-10-CM | POA: Diagnosis not present

## 2019-09-22 ENCOUNTER — Other Ambulatory Visit: Payer: Self-pay | Admitting: Family Medicine

## 2019-09-22 DIAGNOSIS — Z1211 Encounter for screening for malignant neoplasm of colon: Secondary | ICD-10-CM | POA: Diagnosis not present

## 2019-09-22 DIAGNOSIS — R1033 Periumbilical pain: Secondary | ICD-10-CM | POA: Diagnosis not present

## 2019-09-22 DIAGNOSIS — K219 Gastro-esophageal reflux disease without esophagitis: Secondary | ICD-10-CM | POA: Diagnosis not present

## 2019-09-22 DIAGNOSIS — Z1231 Encounter for screening mammogram for malignant neoplasm of breast: Secondary | ICD-10-CM

## 2019-09-29 DIAGNOSIS — R69 Illness, unspecified: Secondary | ICD-10-CM | POA: Diagnosis not present

## 2019-10-15 DIAGNOSIS — Z1159 Encounter for screening for other viral diseases: Secondary | ICD-10-CM | POA: Diagnosis not present

## 2019-10-20 DIAGNOSIS — Z1211 Encounter for screening for malignant neoplasm of colon: Secondary | ICD-10-CM | POA: Diagnosis not present

## 2019-10-20 DIAGNOSIS — K621 Rectal polyp: Secondary | ICD-10-CM | POA: Diagnosis not present

## 2019-10-20 DIAGNOSIS — D123 Benign neoplasm of transverse colon: Secondary | ICD-10-CM | POA: Diagnosis not present

## 2019-10-20 DIAGNOSIS — D124 Benign neoplasm of descending colon: Secondary | ICD-10-CM | POA: Diagnosis not present

## 2019-10-20 DIAGNOSIS — K219 Gastro-esophageal reflux disease without esophagitis: Secondary | ICD-10-CM | POA: Diagnosis not present

## 2019-10-20 DIAGNOSIS — K449 Diaphragmatic hernia without obstruction or gangrene: Secondary | ICD-10-CM | POA: Diagnosis not present

## 2019-10-20 DIAGNOSIS — D122 Benign neoplasm of ascending colon: Secondary | ICD-10-CM | POA: Diagnosis not present

## 2019-10-22 DIAGNOSIS — D122 Benign neoplasm of ascending colon: Secondary | ICD-10-CM | POA: Diagnosis not present

## 2019-10-22 DIAGNOSIS — D123 Benign neoplasm of transverse colon: Secondary | ICD-10-CM | POA: Diagnosis not present

## 2019-10-22 DIAGNOSIS — D124 Benign neoplasm of descending colon: Secondary | ICD-10-CM | POA: Diagnosis not present

## 2019-10-22 DIAGNOSIS — K621 Rectal polyp: Secondary | ICD-10-CM | POA: Diagnosis not present

## 2019-10-29 ENCOUNTER — Other Ambulatory Visit: Payer: Self-pay

## 2019-10-29 ENCOUNTER — Ambulatory Visit
Admission: RE | Admit: 2019-10-29 | Discharge: 2019-10-29 | Disposition: A | Discharge status: Home / Self Care | Payer: Medicare HMO | Source: Ambulatory Visit | Attending: Family Medicine | Admitting: Family Medicine

## 2019-10-29 DIAGNOSIS — Z1231 Encounter for screening mammogram for malignant neoplasm of breast: Secondary | ICD-10-CM

## 2020-01-29 DIAGNOSIS — R7303 Prediabetes: Secondary | ICD-10-CM | POA: Diagnosis not present

## 2020-01-29 DIAGNOSIS — K219 Gastro-esophageal reflux disease without esophagitis: Secondary | ICD-10-CM | POA: Diagnosis not present

## 2020-01-29 DIAGNOSIS — M8580 Other specified disorders of bone density and structure, unspecified site: Secondary | ICD-10-CM | POA: Diagnosis not present

## 2020-01-29 DIAGNOSIS — E785 Hyperlipidemia, unspecified: Secondary | ICD-10-CM | POA: Diagnosis not present

## 2020-01-29 DIAGNOSIS — Z0000 Encounter for general adult medical examination without abnormal findings: Secondary | ICD-10-CM | POA: Diagnosis not present

## 2020-01-29 DIAGNOSIS — Z9889 Other specified postprocedural states: Secondary | ICD-10-CM | POA: Diagnosis not present

## 2020-01-29 DIAGNOSIS — Z79899 Other long term (current) drug therapy: Secondary | ICD-10-CM | POA: Diagnosis not present

## 2020-01-29 DIAGNOSIS — R69 Illness, unspecified: Secondary | ICD-10-CM | POA: Diagnosis not present

## 2020-01-29 DIAGNOSIS — E559 Vitamin D deficiency, unspecified: Secondary | ICD-10-CM | POA: Diagnosis not present

## 2020-01-29 DIAGNOSIS — Z23 Encounter for immunization: Secondary | ICD-10-CM | POA: Diagnosis not present

## 2020-10-11 IMAGING — MG DIGITAL SCREENING BILAT W/ TOMO W/ CAD
8 series · 9 of 24 positions shown · non-contrast
Comparison: Previous exam(s).

CLINICAL DATA: Screening.

EXAM:
DIGITAL SCREENING BILATERAL MAMMOGRAM WITH TOMO AND CAD

[R CC synth-2D]
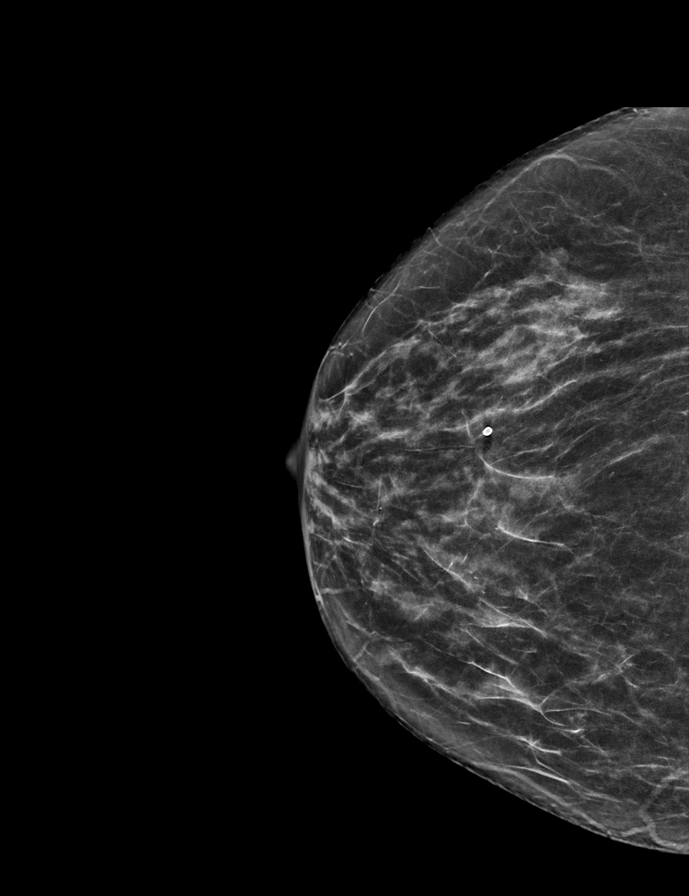

[R MLO synth-2D]
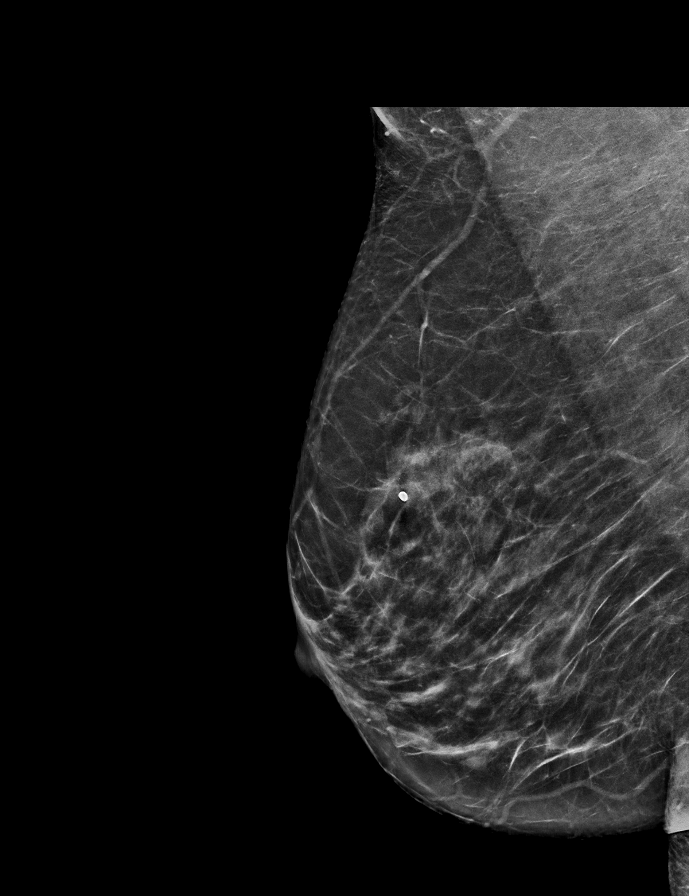

[L CC synth-2D]
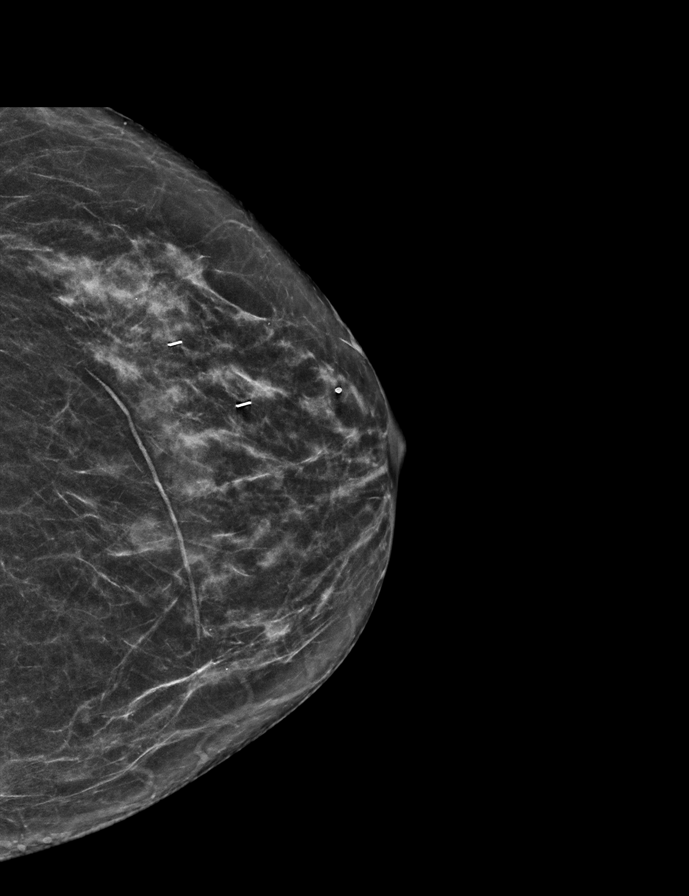

[L MLO synth-2D]
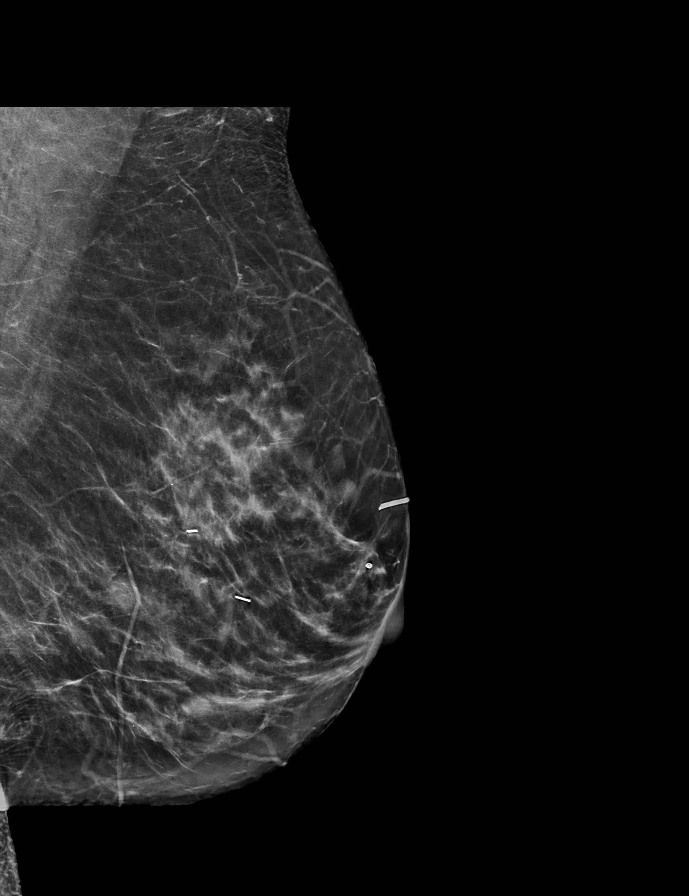

[R CC tomo · 2 of 54 frames shown]
[frame 18/54]
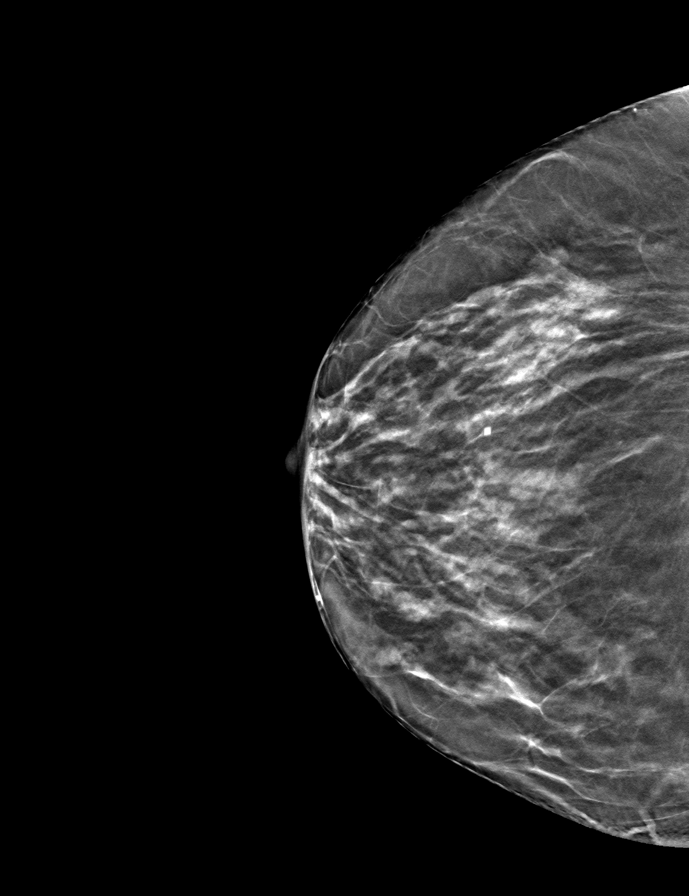
[frame 27/54]
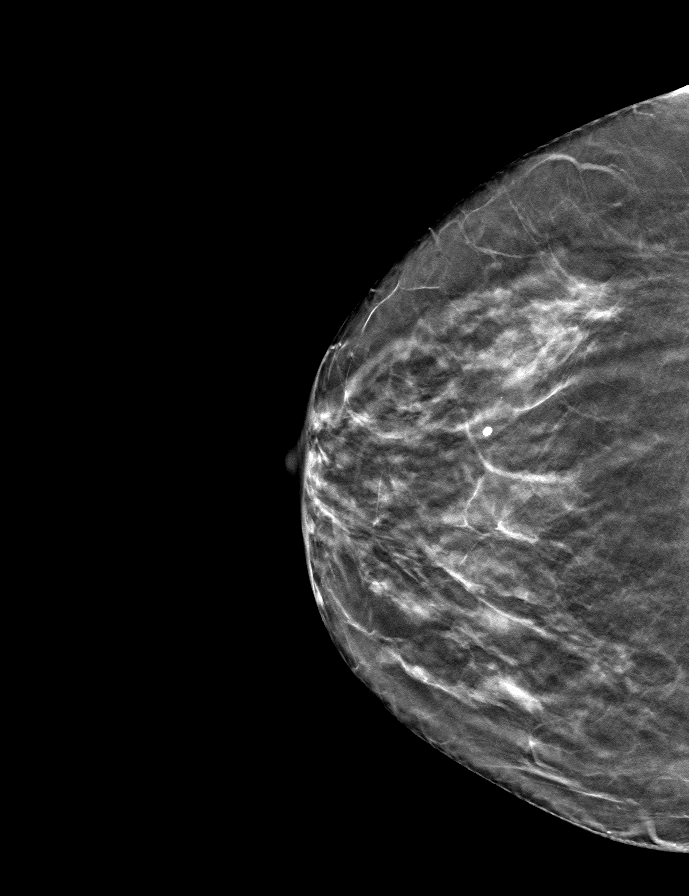

[L MLO tomo · tomo slice 31/60.0]
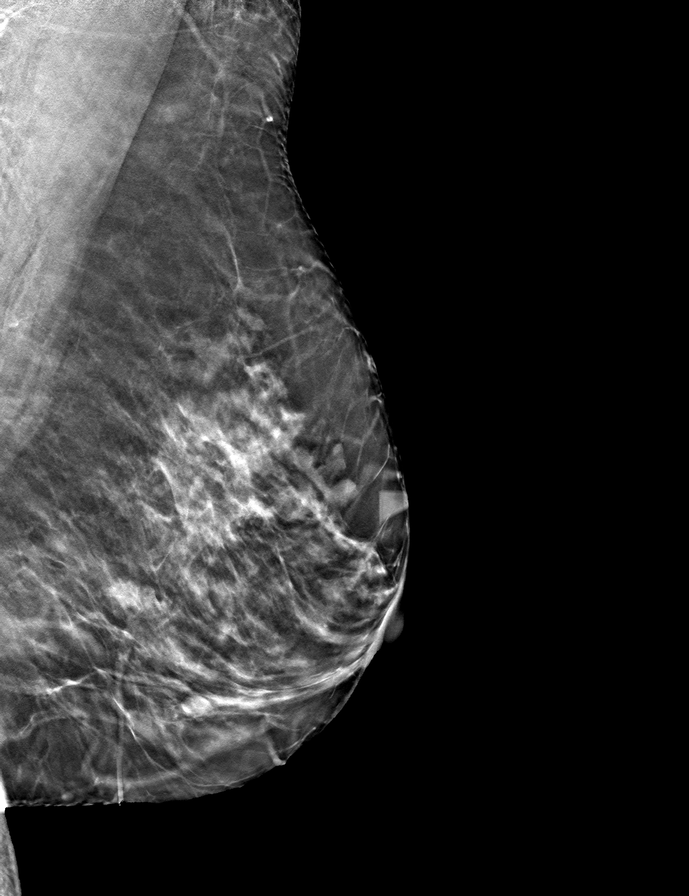

[R MLO tomo · tomo slice 31/60.0]
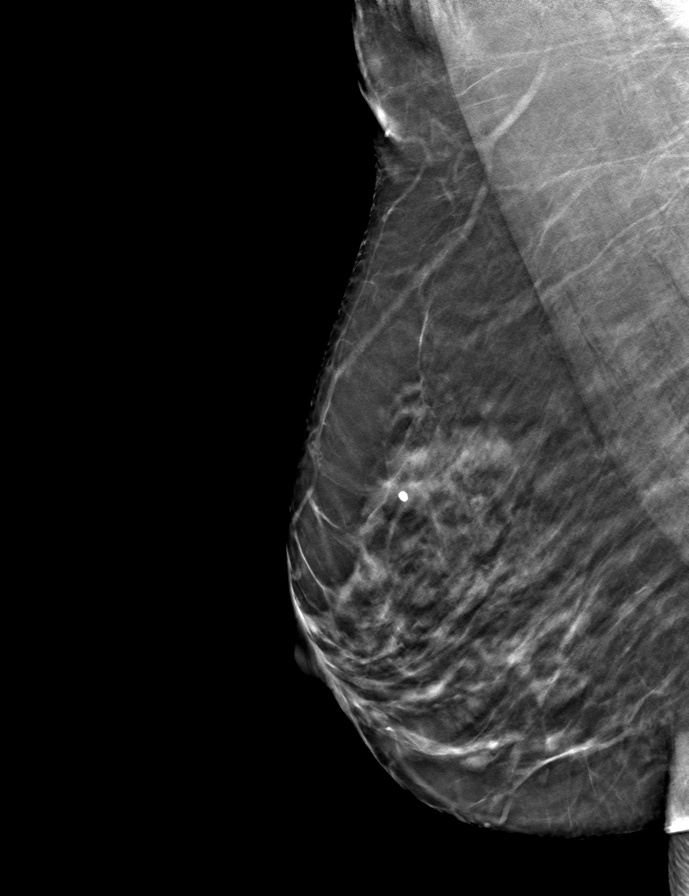

[L CC tomo · tomo slice 27/54.0]
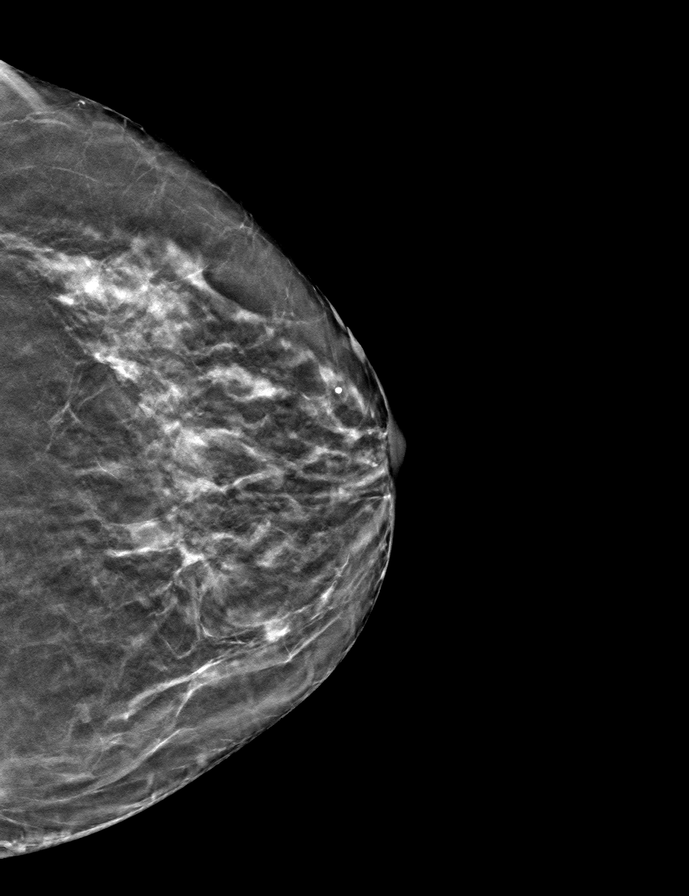

[9 of 24 positions shown; findings below may reference images not displayed]

ACR Breast Density Category c: The breast tissue is heterogeneously
dense, which may obscure small masses.
FINDINGS: There are no findings suspicious for malignancy. Images were
processed with CAD.
IMPRESSION: No mammographic evidence of malignancy. A result letter of this
screening mammogram will be mailed directly to the patient.

RECOMMENDATION:
Screening mammogram in one year. (Code:FT-U-LHB)

BI-RADS CATEGORY  1: Negative.

## 2021-02-01 DIAGNOSIS — E559 Vitamin D deficiency, unspecified: Secondary | ICD-10-CM | POA: Diagnosis not present

## 2021-02-01 DIAGNOSIS — D369 Benign neoplasm, unspecified site: Secondary | ICD-10-CM | POA: Diagnosis not present

## 2021-02-01 DIAGNOSIS — R69 Illness, unspecified: Secondary | ICD-10-CM | POA: Diagnosis not present

## 2021-02-01 DIAGNOSIS — R7303 Prediabetes: Secondary | ICD-10-CM | POA: Diagnosis not present

## 2021-02-01 DIAGNOSIS — E785 Hyperlipidemia, unspecified: Secondary | ICD-10-CM | POA: Diagnosis not present

## 2021-02-01 DIAGNOSIS — K219 Gastro-esophageal reflux disease without esophagitis: Secondary | ICD-10-CM | POA: Diagnosis not present

## 2021-02-01 DIAGNOSIS — E2839 Other primary ovarian failure: Secondary | ICD-10-CM | POA: Diagnosis not present

## 2021-02-01 DIAGNOSIS — Z9889 Other specified postprocedural states: Secondary | ICD-10-CM | POA: Diagnosis not present

## 2021-02-01 DIAGNOSIS — Z0000 Encounter for general adult medical examination without abnormal findings: Secondary | ICD-10-CM | POA: Diagnosis not present

## 2021-02-01 DIAGNOSIS — Z79899 Other long term (current) drug therapy: Secondary | ICD-10-CM | POA: Diagnosis not present

## 2021-02-03 ENCOUNTER — Other Ambulatory Visit: Payer: Self-pay | Admitting: Family Medicine

## 2021-02-03 DIAGNOSIS — Z1231 Encounter for screening mammogram for malignant neoplasm of breast: Secondary | ICD-10-CM

## 2021-02-08 ENCOUNTER — Other Ambulatory Visit: Payer: Self-pay | Admitting: Family Medicine

## 2021-02-08 ENCOUNTER — Other Ambulatory Visit: Payer: Self-pay

## 2021-02-08 ENCOUNTER — Ambulatory Visit
Admission: RE | Admit: 2021-02-08 | Discharge: 2021-02-08 | Disposition: A | Discharge status: Home / Self Care | Payer: Medicare HMO | Source: Ambulatory Visit | Attending: Family Medicine | Admitting: Family Medicine

## 2021-02-08 DIAGNOSIS — Z1231 Encounter for screening mammogram for malignant neoplasm of breast: Secondary | ICD-10-CM

## 2021-02-08 DIAGNOSIS — E2839 Other primary ovarian failure: Secondary | ICD-10-CM

## 2021-02-17 ENCOUNTER — Other Ambulatory Visit: Payer: Self-pay

## 2021-02-17 ENCOUNTER — Ambulatory Visit
Admission: RE | Admit: 2021-02-17 | Discharge: 2021-02-17 | Disposition: A | Discharge status: Home / Self Care | Payer: Medicare HMO | Source: Ambulatory Visit | Attending: Family Medicine | Admitting: Family Medicine

## 2021-02-17 DIAGNOSIS — M810 Age-related osteoporosis without current pathological fracture: Secondary | ICD-10-CM | POA: Diagnosis not present

## 2021-02-17 DIAGNOSIS — Z780 Asymptomatic menopausal state: Secondary | ICD-10-CM | POA: Diagnosis not present

## 2021-02-17 DIAGNOSIS — E2839 Other primary ovarian failure: Secondary | ICD-10-CM

## 2021-06-27 DIAGNOSIS — Z23 Encounter for immunization: Secondary | ICD-10-CM | POA: Diagnosis not present

## 2021-06-27 DIAGNOSIS — G4700 Insomnia, unspecified: Secondary | ICD-10-CM | POA: Diagnosis not present

## 2021-07-27 DIAGNOSIS — H5213 Myopia, bilateral: Secondary | ICD-10-CM | POA: Diagnosis not present

## 2021-11-01 DIAGNOSIS — Z0100 Encounter for examination of eyes and vision without abnormal findings: Secondary | ICD-10-CM | POA: Diagnosis not present

## 2022-01-11 ENCOUNTER — Other Ambulatory Visit: Payer: Self-pay | Admitting: Family Medicine

## 2022-01-11 DIAGNOSIS — Z1231 Encounter for screening mammogram for malignant neoplasm of breast: Secondary | ICD-10-CM

## 2022-02-15 ENCOUNTER — Ambulatory Visit
Admission: RE | Admit: 2022-02-15 | Discharge: 2022-02-15 | Disposition: A | Discharge status: Home / Self Care | Payer: Medicare HMO | Source: Ambulatory Visit | Attending: Family Medicine | Admitting: Family Medicine

## 2022-02-15 DIAGNOSIS — Z1231 Encounter for screening mammogram for malignant neoplasm of breast: Secondary | ICD-10-CM | POA: Diagnosis not present

## 2022-02-16 ENCOUNTER — Other Ambulatory Visit: Payer: Self-pay | Admitting: Family Medicine

## 2022-02-16 DIAGNOSIS — R928 Other abnormal and inconclusive findings on diagnostic imaging of breast: Secondary | ICD-10-CM

## 2022-02-21 DIAGNOSIS — D369 Benign neoplasm, unspecified site: Secondary | ICD-10-CM | POA: Diagnosis not present

## 2022-02-21 DIAGNOSIS — Z79899 Other long term (current) drug therapy: Secondary | ICD-10-CM | POA: Diagnosis not present

## 2022-02-21 DIAGNOSIS — Z0000 Encounter for general adult medical examination without abnormal findings: Secondary | ICD-10-CM | POA: Diagnosis not present

## 2022-02-21 DIAGNOSIS — E785 Hyperlipidemia, unspecified: Secondary | ICD-10-CM | POA: Diagnosis not present

## 2022-02-21 DIAGNOSIS — R7303 Prediabetes: Secondary | ICD-10-CM | POA: Diagnosis not present

## 2022-02-21 DIAGNOSIS — R682 Dry mouth, unspecified: Secondary | ICD-10-CM | POA: Diagnosis not present

## 2022-02-21 DIAGNOSIS — K219 Gastro-esophageal reflux disease without esophagitis: Secondary | ICD-10-CM | POA: Diagnosis not present

## 2022-02-21 DIAGNOSIS — M8580 Other specified disorders of bone density and structure, unspecified site: Secondary | ICD-10-CM | POA: Diagnosis not present

## 2022-02-21 DIAGNOSIS — E2839 Other primary ovarian failure: Secondary | ICD-10-CM | POA: Diagnosis not present

## 2022-02-21 DIAGNOSIS — E559 Vitamin D deficiency, unspecified: Secondary | ICD-10-CM | POA: Diagnosis not present

## 2022-02-21 DIAGNOSIS — R0989 Other specified symptoms and signs involving the circulatory and respiratory systems: Secondary | ICD-10-CM | POA: Diagnosis not present

## 2022-02-21 DIAGNOSIS — R69 Illness, unspecified: Secondary | ICD-10-CM | POA: Diagnosis not present

## 2022-02-24 ENCOUNTER — Ambulatory Visit
Admission: RE | Admit: 2022-02-24 | Discharge: 2022-02-24 | Disposition: A | Discharge status: Home / Self Care | Payer: Medicare HMO | Source: Ambulatory Visit | Attending: Family Medicine | Admitting: Family Medicine

## 2022-02-24 DIAGNOSIS — R928 Other abnormal and inconclusive findings on diagnostic imaging of breast: Secondary | ICD-10-CM

## 2022-02-24 DIAGNOSIS — N6002 Solitary cyst of left breast: Secondary | ICD-10-CM | POA: Diagnosis not present

## 2022-05-16 DIAGNOSIS — Z23 Encounter for immunization: Secondary | ICD-10-CM | POA: Diagnosis not present

## 2022-05-16 DIAGNOSIS — Z7184 Encounter for health counseling related to travel: Secondary | ICD-10-CM | POA: Diagnosis not present

## 2023-03-07 ENCOUNTER — Other Ambulatory Visit: Payer: Self-pay | Admitting: Family Medicine

## 2023-03-07 DIAGNOSIS — Z1231 Encounter for screening mammogram for malignant neoplasm of breast: Secondary | ICD-10-CM

## 2023-03-21 DIAGNOSIS — M810 Age-related osteoporosis without current pathological fracture: Secondary | ICD-10-CM | POA: Diagnosis not present

## 2023-03-21 DIAGNOSIS — F330 Major depressive disorder, recurrent, mild: Secondary | ICD-10-CM | POA: Diagnosis not present

## 2023-03-21 DIAGNOSIS — E559 Vitamin D deficiency, unspecified: Secondary | ICD-10-CM | POA: Diagnosis not present

## 2023-03-21 DIAGNOSIS — Z9889 Other specified postprocedural states: Secondary | ICD-10-CM | POA: Diagnosis not present

## 2023-03-21 DIAGNOSIS — Z79899 Other long term (current) drug therapy: Secondary | ICD-10-CM | POA: Diagnosis not present

## 2023-03-21 DIAGNOSIS — Z0000 Encounter for general adult medical examination without abnormal findings: Secondary | ICD-10-CM | POA: Diagnosis not present

## 2023-03-21 DIAGNOSIS — E785 Hyperlipidemia, unspecified: Secondary | ICD-10-CM | POA: Diagnosis not present

## 2023-03-21 DIAGNOSIS — Z853 Personal history of malignant neoplasm of breast: Secondary | ICD-10-CM | POA: Diagnosis not present

## 2023-03-21 DIAGNOSIS — R7303 Prediabetes: Secondary | ICD-10-CM | POA: Diagnosis not present

## 2023-03-23 ENCOUNTER — Other Ambulatory Visit: Payer: Self-pay | Admitting: Family Medicine

## 2023-03-23 DIAGNOSIS — M810 Age-related osteoporosis without current pathological fracture: Secondary | ICD-10-CM

## 2023-04-13 ENCOUNTER — Ambulatory Visit
Admission: RE | Admit: 2023-04-13 | Discharge: 2023-04-13 | Disposition: A | Discharge status: Home / Self Care | Payer: Medicare HMO | Source: Ambulatory Visit | Attending: Family Medicine | Admitting: Family Medicine

## 2023-04-13 DIAGNOSIS — Z1231 Encounter for screening mammogram for malignant neoplasm of breast: Secondary | ICD-10-CM

## 2023-06-15 DIAGNOSIS — E349 Endocrine disorder, unspecified: Secondary | ICD-10-CM | POA: Diagnosis not present

## 2023-06-15 DIAGNOSIS — M8588 Other specified disorders of bone density and structure, other site: Secondary | ICD-10-CM | POA: Diagnosis not present

## 2023-10-02 ENCOUNTER — Other Ambulatory Visit: Payer: Medicare HMO

## 2023-12-10 DIAGNOSIS — M545 Low back pain, unspecified: Secondary | ICD-10-CM | POA: Diagnosis not present

## 2024-01-10 DIAGNOSIS — M545 Low back pain, unspecified: Secondary | ICD-10-CM | POA: Diagnosis not present

## 2024-01-17 DIAGNOSIS — M545 Low back pain, unspecified: Secondary | ICD-10-CM | POA: Diagnosis not present

## 2024-01-22 DIAGNOSIS — M545 Low back pain, unspecified: Secondary | ICD-10-CM | POA: Diagnosis not present

## 2024-01-29 DIAGNOSIS — M545 Low back pain, unspecified: Secondary | ICD-10-CM | POA: Diagnosis not present

## 2024-02-05 DIAGNOSIS — M545 Low back pain, unspecified: Secondary | ICD-10-CM | POA: Diagnosis not present

## 2024-02-19 DIAGNOSIS — M545 Low back pain, unspecified: Secondary | ICD-10-CM | POA: Diagnosis not present

## 2024-02-26 DIAGNOSIS — M545 Low back pain, unspecified: Secondary | ICD-10-CM | POA: Diagnosis not present

## 2024-02-27 ENCOUNTER — Other Ambulatory Visit: Payer: Self-pay | Admitting: Family Medicine

## 2024-02-27 DIAGNOSIS — Z1231 Encounter for screening mammogram for malignant neoplasm of breast: Secondary | ICD-10-CM

## 2024-03-25 DIAGNOSIS — Z0000 Encounter for general adult medical examination without abnormal findings: Secondary | ICD-10-CM | POA: Diagnosis not present

## 2024-03-25 DIAGNOSIS — F331 Major depressive disorder, recurrent, moderate: Secondary | ICD-10-CM | POA: Diagnosis not present

## 2024-03-25 DIAGNOSIS — R7303 Prediabetes: Secondary | ICD-10-CM | POA: Diagnosis not present

## 2024-03-25 DIAGNOSIS — M545 Low back pain, unspecified: Secondary | ICD-10-CM | POA: Diagnosis not present

## 2024-03-25 DIAGNOSIS — M810 Age-related osteoporosis without current pathological fracture: Secondary | ICD-10-CM | POA: Diagnosis not present

## 2024-03-25 DIAGNOSIS — E785 Hyperlipidemia, unspecified: Secondary | ICD-10-CM | POA: Diagnosis not present

## 2024-03-25 DIAGNOSIS — Z79899 Other long term (current) drug therapy: Secondary | ICD-10-CM | POA: Diagnosis not present

## 2024-03-25 DIAGNOSIS — Z853 Personal history of malignant neoplasm of breast: Secondary | ICD-10-CM | POA: Diagnosis not present

## 2024-03-25 DIAGNOSIS — E559 Vitamin D deficiency, unspecified: Secondary | ICD-10-CM | POA: Diagnosis not present

## 2024-03-26 ENCOUNTER — Ambulatory Visit
Admission: RE | Admit: 2024-03-26 | Discharge: 2024-03-26 | Disposition: A | Discharge status: Home / Self Care | Source: Ambulatory Visit | Attending: Family Medicine | Admitting: Family Medicine

## 2024-03-26 ENCOUNTER — Other Ambulatory Visit: Payer: Self-pay | Admitting: Family Medicine

## 2024-03-26 DIAGNOSIS — M545 Low back pain, unspecified: Secondary | ICD-10-CM | POA: Diagnosis not present

## 2024-04-15 ENCOUNTER — Ambulatory Visit

## 2024-04-22 ENCOUNTER — Ambulatory Visit
Admission: RE | Admit: 2024-04-22 | Discharge: 2024-04-22 | Disposition: A | Discharge status: Home / Self Care | Source: Ambulatory Visit | Attending: Family Medicine | Admitting: Family Medicine

## 2024-04-22 DIAGNOSIS — Z1231 Encounter for screening mammogram for malignant neoplasm of breast: Secondary | ICD-10-CM | POA: Diagnosis not present

## 2024-04-28 DIAGNOSIS — M545 Low back pain, unspecified: Secondary | ICD-10-CM | POA: Diagnosis not present

## 2024-04-28 DIAGNOSIS — M4696 Unspecified inflammatory spondylopathy, lumbar region: Secondary | ICD-10-CM | POA: Diagnosis not present

## 2024-07-09 DIAGNOSIS — Z860101 Personal history of adenomatous and serrated colon polyps: Secondary | ICD-10-CM | POA: Diagnosis not present

## 2024-07-09 DIAGNOSIS — K219 Gastro-esophageal reflux disease without esophagitis: Secondary | ICD-10-CM | POA: Diagnosis not present

## 2024-07-10 DIAGNOSIS — F330 Major depressive disorder, recurrent, mild: Secondary | ICD-10-CM | POA: Diagnosis not present

## 2024-07-10 DIAGNOSIS — G4700 Insomnia, unspecified: Secondary | ICD-10-CM | POA: Diagnosis not present

## 2024-08-21 DIAGNOSIS — G8929 Other chronic pain: Secondary | ICD-10-CM | POA: Diagnosis not present

## 2024-08-21 DIAGNOSIS — M545 Low back pain, unspecified: Secondary | ICD-10-CM | POA: Diagnosis not present

## 2024-08-21 DIAGNOSIS — F330 Major depressive disorder, recurrent, mild: Secondary | ICD-10-CM | POA: Diagnosis not present
